# Patient Record
Sex: Male | Born: 1990 | Race: Asian | Hispanic: No | Marital: Single | Smoking: Former smoker
Health system: Southern US, Community
[De-identification: ages and names within clinical notes are randomized; demographics above are authoritative.]

## PROBLEM LIST (undated history)

## (undated) DIAGNOSIS — A150 Tuberculosis of lung: Secondary | ICD-10-CM

---

## 2016-05-25 ENCOUNTER — Telehealth: Payer: Self-pay | Admitting: Pulmonary Disease

## 2016-05-25 NOTE — Telephone Encounter (Signed)
Nurse with [facility] called, states pt is positive for TB. She states she advised us to call and cancel pt appt for tomorrow, as pt needs to be in isolation. She states pt did not want to cancel appt, due to he wanted a 2nd opinion.

## 2016-05-25 NOTE — Telephone Encounter (Signed)
Called pt and informed him we are cancelling his appt per DK since pt has tested positive for TB. Informed pt once I speak with DS that we will get a bronch scheduled per DK and proceed from there. Pt is asking that a provider calls him in regards to this. Will forward to DS.

## 2016-05-26 ENCOUNTER — Ambulatory Visit: Payer: Self-pay | Admitting: Pulmonary Disease

## 2016-05-26 NOTE — Telephone Encounter (Signed)
Noted  

## 2016-05-26 NOTE — Telephone Encounter (Signed)
Spoke with Todd Morse at [facility] and she states pt is being treated and per DS they are to take every specimen the pt can given. nothing further needed.

## 2016-05-26 NOTE — Telephone Encounter (Signed)
Per DS pt doesn't need to have a bronch at this time. He states [facility] needs to check anything sputum that the pt can bring up and needs to be treated with anti TB meds. LMOVM for the nurse to return my call.

## 2016-06-02 ENCOUNTER — Encounter: Payer: Self-pay | Admitting: Emergency Medicine

## 2016-06-02 ENCOUNTER — Emergency Department: Payer: Self-pay

## 2016-06-02 ENCOUNTER — Emergency Department
Admission: EM | Admit: 2016-06-02 | Discharge: 2016-06-02 | Disposition: A | Discharge status: Home / Self Care | Payer: Self-pay | Attending: Emergency Medicine | Admitting: Emergency Medicine

## 2016-06-02 DIAGNOSIS — Z87891 Personal history of nicotine dependence: Secondary | ICD-10-CM | POA: Insufficient documentation

## 2016-06-02 DIAGNOSIS — A159 Respiratory tuberculosis unspecified: Secondary | ICD-10-CM | POA: Insufficient documentation

## 2016-06-02 HISTORY — DX: Tuberculosis of lung: A15.0

## 2016-06-02 LAB — CBC
HEMATOCRIT: 42.5 % (ref 40.0–52.0)
Hemoglobin: 14.5 g/dL (ref 13.0–18.0)
MCH: 25.8 pg — AB (ref 26.0–34.0)
MCHC: 34.1 g/dL (ref 32.0–36.0)
MCV: 75.7 fL — AB (ref 80.0–100.0)
PLATELETS: 275 10*3/uL (ref 150–440)
RBC: 5.61 MIL/uL (ref 4.40–5.90)
RDW: 15.4 % — ABNORMAL HIGH (ref 11.5–14.5)
WBC: 6.1 10*3/uL (ref 3.8–10.6)

## 2016-06-02 LAB — BASIC METABOLIC PANEL
Anion gap: 12 (ref 5–15)
BUN: 8 mg/dL (ref 6–20)
CHLORIDE: 96 mmol/L — AB (ref 101–111)
CO2: 29 mmol/L (ref 22–32)
CREATININE: 0.83 mg/dL (ref 0.61–1.24)
Calcium: 9.6 mg/dL (ref 8.9–10.3)
GFR calc non Af Amer: >60 mL/min (ref >60)
Glucose, Bld: 90 mg/dL (ref 65–99)
POTASSIUM: 4.2 mmol/L (ref 3.5–5.1)
Sodium: 137 mmol/L (ref 135–145)

## 2016-06-02 LAB — HEPATIC FUNCTION PANEL
ALK PHOS: 96 U/L (ref 38–126)
ALT: 23 U/L (ref 17–63)
AST: 30 U/L (ref 15–41)
Albumin: 3.9 g/dL (ref 3.5–5.0)
BILIRUBIN DIRECT: 0.2 mg/dL (ref 0.1–0.5)
BILIRUBIN TOTAL: 1 mg/dL (ref 0.3–1.2)
Indirect Bilirubin: 0.8 mg/dL (ref 0.3–0.9)
Total Protein: 9 g/dL — ABNORMAL HIGH (ref 6.5–8.1)

## 2016-06-02 LAB — TROPONIN I: Troponin I: <0.03 ng/mL (ref <0.03)

## 2016-06-02 MED ORDER — SODIUM CHLORIDE 0.9 % IV BOLUS (SEPSIS)
1000.0000 mL | Freq: Once | INTRAVENOUS | Status: AC
  Administered 2016-06-02: 1000 mL via INTRAVENOUS

## 2016-06-02 MED ORDER — GUAIFENESIN-CODEINE 100-10 MG/5ML PO SOLN: 5.0000 mL | Freq: Three times a day (TID) | ORAL | 0 refills | Status: AC | PRN

## 2016-06-02 MED ORDER — IOPAMIDOL (ISOVUE-370) INJECTION 76%
75.0000 mL | Freq: Once | INTRAVENOUS | Status: AC | PRN
  Administered 2016-06-02: 75 mL via INTRAVENOUS

## 2016-06-02 NOTE — ED Notes (Signed)
chest xray at bedside

## 2016-06-02 NOTE — Consult Note (Signed)
[facility] Infectious Disease     Reason for Consult: Pulmonary TB    Referring Physician: Shirlyn Goltz  Date of Admission:  06/02/2016   Active Problems:   * No active hospital problems. *   HPI: Rockland Kotarski is a 26 y.o. 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 He feels a rattle in his right chest as well when used to just fel in L.  He has been tolerating the therapy well otherwise He has no fever,wbc nml. CT   Past Medical History:  Diagnosis Date  . TB (pulmonary tuberculosis)    History reviewed. No pertinent surgical history. Social History  Substance Use Topics  . Smoking status: Former Smoker    Quit date: 04/01/2016  . Smokeless tobacco: Never Used  . Alcohol use No   No family history on file.  Allergies: No Known Allergies  Current antibiotics: Antibiotics Given (last 72 hours)    None      MEDICATIONS:   Review of Systems - 11 systems reviewed and negative per HPI   OBJECTIVE: Temp:  [98.2 F (36.8 C)] 98.2 F (36.8 C) (02/16 1324) Pulse Rate:  [90-102] 102 (02/16 1359) Resp:  [20-27] 20 (02/16 1359) BP: (140-141)/(87-96) 140/96 (02/16 1330) SpO2:  [97 %-100 %] 100 % (02/16 1359) Weight:  [68.5 kg (151 lb)] 68.5 kg (151 lb) (02/16 1325) Physical Exam  Constitutional: He is oriented to person, place, and time. He appears well-developed and well-nourished. No distress.  HENT:  Mouth/Throat: Oropharynx is clear and moist. No oropharyngeal exudate.  Cardiovascular: Normal rate, regular rhythm and normal heart sounds. Exam reveals no gallop and no friction rub.  No murmur heard.  Pulmonary/Chest: Rhonchi L baseAbdominal: Soft. Bowel sounds are normal. He exhibits no distension. There is no tenderness.  Lymphadenopathy: He has no cervical adenopathy.  Neurological: He is alert and oriented to person, place, and time.  Skin: Skin is warm and dry. No rash noted.  No erythema.  Psychiatric: He has a normal mood and affect. His behavior is normal.     LABS: Results for orders placed or performed during the hospital encounter of 06/02/16 (from the past 48 hour(s))  Basic metabolic panel     Status: Abnormal   Collection Time: 06/02/16  1:41 PM  Result Value Ref Range   Sodium 137 135 - 145 mmol/L   Potassium 4.2 3.5 - 5.1 mmol/L   Chloride 96 (L) 101 - 111 mmol/L   CO2 29 22 - 32 mmol/L   Glucose, Bld 90 65 - 99 mg/dL   BUN 8 6 - 20 mg/dL   Creatinine, Ser 0.83 0.61 - 1.24 mg/dL   Calcium 9.6 8.9 - 10.3 mg/dL   GFR calc non Af Amer >60 >60 mL/min   GFR calc Af Amer >60 >60 mL/min    Comment: (NOTE) The eGFR has been calculated using the CKD EPI equation. This calculation has not been validated in all clinical situations. eGFR's persistently <60 mL/min signify possible Chronic Kidney Disease.    Anion gap 12 5 - 15  CBC     Status: Abnormal   Collection Time: 06/02/16  1:41 PM  Result Value Ref Range   WBC 6.1 3.8 - 10.6 K/uL   RBC 5.61 4.40 - 5.90 MIL/uL   Hemoglobin 14.5 13.0 - 18.0 g/dL   HCT 42.5 40.0 - 52.0 %   MCV 75.7 (L) 80.0 -  100.0 fL   MCH 25.8 (L) 26.0 - 34.0 pg   MCHC 34.1 32.0 - 36.0 g/dL   RDW 15.4 (H) 11.5 - 14.5 %   Platelets 275 150 - 440 K/uL  Troponin I     Status: None   Collection Time: 06/02/16  1:41 PM  Result Value Ref Range   Troponin I <0.03 <0.03 ng/mL   No components found for: ESR, C REACTIVE PROTEIN MICRO: No results found for this or any previous visit (from the past 720 hour(s)).  IMAGING: Ct Angio Chest Pe W And/or Wo Contrast  Result Date: 06/02/2016 CLINICAL DATA:  Chest pain for 3 days. Patient diagnosed with tuberculosis 1 week ago. EXAM: CT ANGIOGRAPHY CHEST WITH CONTRAST TECHNIQUE: Multidetector CT imaging of the chest was performed using the standard protocol during bolus administration of intravenous contrast. Multiplanar CT image reconstructions and MIPs were obtained to evaluate the  vascular anatomy. CONTRAST:  75 cc Isovue 370. COMPARISON:  None. FINDINGS: Cardiovascular: No pulmonary embolus is identified. Heart size is normal. No pericardial effusion. Mediastinum/Nodes: No axillary, hilar or mediastinal lymphadenopathy. Thyroid appears normal. Small left pleural effusion is identified. Lungs/Pleura: Tree-in-bud opacities are seen in the posterior right lower lobe and right upper lobe. Consolidation and cavitation are seen in the superior segment of the right lower lobe. Upper Abdomen: No acute abnormality. Musculoskeletal: Schmorl's nodes in the lower thoracic spine are identified. No worrisome bony lesion. Review of the MIP images confirms the above findings. IMPRESSION: Negative for pulmonary embolus. Bilateral tree-in-bud opacities and consolidation with cavitation in the superior segment of the left upper lobe compatible with the patient's diagnosis of tuberculosis. Electronically Signed   By: Inge Rise M.D.   On: 06/02/2016 15:03   Dg Chest Portable 1 View  Result Date: 06/02/2016 CLINICAL DATA:  Pulmonary tuberculosis. Right-sided pleuritic chest pain. EXAM: PORTABLE CHEST 1 VIEW COMPARISON:  05/18/2016 FINDINGS: There remains left-sided perihilar/ lower lobe pulmonary airspace disease and right upper lung reticulonodular disease. No edema, pneumothorax or pleural fluid identified. The heart size and mediastinal contours are within normal limits. IMPRESSION: No significant change in appearance of left perihilar/ lower lobe pulmonary airspace disease and reticulonodular disease in the right upper lobe. Electronically Signed   By: Aletta Edouard M.D.   On: 06/02/2016 15:10    Assessment:   Debbie Bellucci is a 26 y.o. male with recent dx of pulmonary TB on therapy for 10 days now with worsening cough, some chest pain. Otherwise doing well.  CT reviewed and discussed with patient.  I suspect he has IRIS which can occur with TB patients when they are about 2 weeks into  treatment. He has no signs of sepsis or other worrisome findings.   Recommendations Discussed cough suppression with cheratussin Ibuprofen 600-800 mg tid with food for 3-4 days Can follow up with health dept Thank you very much for allowing me to participate in the care of this patient. Please call with questions.   Cheral Marker. Ola Spurr, MD

## 2016-06-02 NOTE — ED Triage Notes (Signed)
Patient presents to ED via POV with c/o CP x 3 days. Patient also states both of his lungs hurt. Patient was dx with TB 1 week ago. [facility] for [facility]. He told the nurse there that he was coughing a lot and having CP and they said the CP was probably related to the coughing. Patient A&O x4. VSS.

## 2016-06-02 NOTE — ED Provider Notes (Addendum)
[facility]-EMERGENCY DEPARTMENT Provider Note   CSN: [id number] Arrival date & time: 06/02/16  1318     History   Chief Complaint Chief Complaint  Patient presents with  . Chest Pain    HPI Todd Morse is a 26 y.o. male from [place] here presenting with chest pain, shortness of breath. Patient came here from [place] in 2016. Has been coughing for several months and [facility]. He had a chest x-ray that time showed reticular nodular disease consistent with possible TB. Patient went to the health department in the following day and had labs drawn and sputum culture done. HIV and RPR were negative. Sputum culture + AFB x 2 and culture grew out TB. Patient was started on 4 drug regimen for TB on 2/7. Patient states that he has worsening chest pain and shortness of breath for several days. Denies fevers. Has persistent cough. Felt weak overall. Denies recent travel.   The history is provided by the patient.    Past Medical History:  Diagnosis Date  . TB (pulmonary tuberculosis)     There are no active problems to display for this patient.   History reviewed. No pertinent surgical history.     Home Medications    Prior to Admission medications   Not on File    Family History No family history on file.  Social History Social History  Substance Use Topics  . Smoking status: Former Smoker    Quit date: 04/01/2016  . Smokeless tobacco: Never Used  . Alcohol use No     Allergies   Patient has no known allergies.   Review of Systems Review of Systems  Respiratory: Positive for shortness of breath.   Cardiovascular: Positive for chest pain.  All other systems reviewed and are negative.    Physical Exam Updated Vital Signs BP (!) 140/96   Pulse (!) 102   Temp 98.2 F (36.8 C) (Oral)   Resp 20   Ht 5\' 7"  (1.702 m)   Wt 151 lb (68.5 kg)   SpO2 100%   BMI 23.65 kg/m   Physical Exam  Constitutional: He is oriented to person, place, and  time.  Slightly dehydrated   HENT:  Head: Normocephalic.  MM slightly dry   Eyes: EOM are normal. Pupils are equal, round, and reactive to light.  Neck: Normal range of motion. Neck supple.  Cardiovascular:  Tachycardic   Pulmonary/Chest: Effort normal.  Diminished throughout. No obvious wheezing or crackles   Abdominal: Soft. Bowel sounds are normal. He exhibits no distension. There is no tenderness. There is no guarding.  Musculoskeletal: Normal range of motion. He exhibits no edema.  Neurological: He is alert and oriented to person, place, and time. No cranial nerve deficit. Coordination normal.  Skin: Skin is warm.  Psychiatric: He has a normal mood and affect.  Nursing note and vitals reviewed.    ED Treatments / Results  Labs (all labs ordered are listed, but only abnormal results are displayed) Labs Reviewed  BASIC METABOLIC PANEL - Abnormal; Notable for the following:       Result Value   Chloride 96 (*)    All other components within normal limits  CBC - Abnormal; Notable for the following:    MCV 75.7 (*)    MCH 25.8 (*)    RDW 15.4 (*)    All other components within normal limits  TROPONIN I    EKG  EKG Interpretation None  ED ECG REPORT I, Richardean Canal, the attending physician, personally viewed and interpreted this ECG.   Date: 06/02/2016  EKG Time: 13:21   Rate: 96  Rhythm: normal EKG, normal sinus rhythm  Axis: normal  Intervals:none  ST&T Change: nonspecific   Radiology No results found.  Procedures Procedures (including critical care time)  Medications Ordered in ED Medications  sodium chloride 0.9 % bolus 1,000 mL (1,000 mLs Intravenous New Bag/Given 06/02/16 1352)  iopamidol (ISOVUE-370) 76 % injection 75 mL (75 mLs Intravenous Contrast Given 06/02/16 1444)     Initial Impression / Assessment and Plan / ED Course  I have reviewed the triage vital signs and the nursing notes.  Pertinent labs & imaging results that were available  during my care of the patient were reviewed by me and considered in my medical decision making (see chart for details).    Todd Morse is a 26 y.o. male here with cough, shortness of breath. Recent diagnosis of active TB confirmed by xray, sputum culture. Already on 4 drug regimen. I called the health department nurse, Marcelino Duster, who faxed over his records. I discussed case with Dr. Sampson Goon from ID. Given persistent cough, tachycardia, will get CT angio chest. He will see patient in the ED.   3:28 PM Labs unremarkable. CT showed active TB. Dr. Sampson Goon to see patient. Dispo as per ID. Signed out to Dr. Derrill Kay in the ED.   3:44 PM Dr. Sampson Goon saw patient. Thought that this is the expected course of the TB treatment. Recommend robitussin with guaifenesin for cough, motrin. Will follow up with ID and health department outpatient.    Final Clinical Impressions(s) / ED Diagnoses   Final diagnoses:  None    New Prescriptions New Prescriptions   No medications on file     Charlynne Pander, MD 06/02/16 1528    Charlynne Pander, MD 06/02/16 [phone]

## 2016-06-02 NOTE — Discharge Instructions (Signed)
Continue taking your medicines for TB.   Take motrin for pain or fevers   Take guaifenesin-codeine for cough.   Follow up with the health department.   Return to ER if you have fever, trouble breathing, chest pain.

## 2016-06-05 ENCOUNTER — Ambulatory Visit: Payer: Self-pay | Admitting: Pulmonary Disease

## 2016-11-12 ENCOUNTER — Ambulatory Visit
Admission: RE | Admit: 2016-11-12 | Discharge: 2016-11-12 | Disposition: A | Discharge status: Home / Self Care | Payer: Self-pay | Source: Ambulatory Visit | Attending: Family Medicine | Admitting: Family Medicine

## 2016-11-12 ENCOUNTER — Other Ambulatory Visit (HOSPITAL_COMMUNITY): Payer: Self-pay | Admitting: Family Medicine

## 2016-11-12 DIAGNOSIS — A159 Respiratory tuberculosis unspecified: Secondary | ICD-10-CM

## 2017-09-06 IMAGING — CT CT ANGIO CHEST
2 of 6 series · 19 of 46 positions shown · IV contrast (APPLIED)
Comparison: None.

CLINICAL DATA: Chest pain for 3 days. Patient diagnosed with
tuberculosis 1 week ago.

EXAM:
CT ANGIOGRAPHY CHEST WITH CONTRAST
TECHNIQUE: Multidetector CT imaging of the chest was performed using the
standard protocol during bolus administration of intravenous
contrast. Multiplanar CT image reconstructions and MIPs were
obtained to evaluate the vascular anatomy.
CONTRAST:  75 cc Isovue 370.

[Series 5: thins · axial · 0.68mm/px · z∈[-364,-69]mm · 17 of 325 slices shown]
[im 15/325  lung]
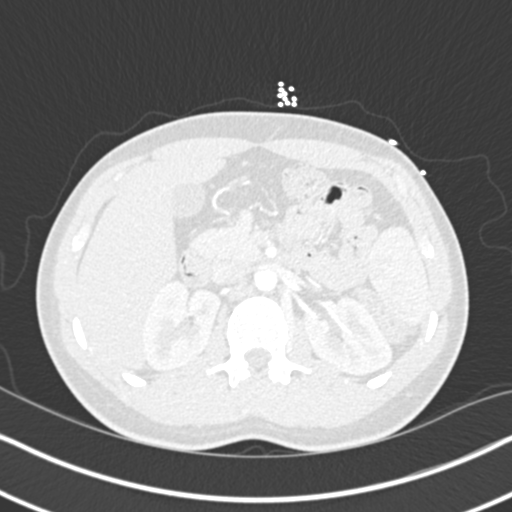
[im 29/325  soft-tissue]
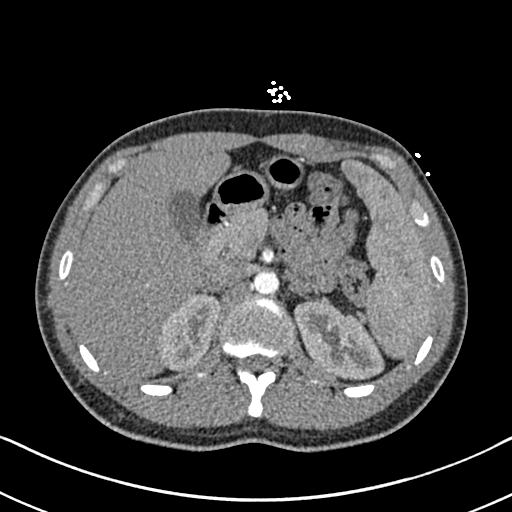
[im 57/325  lung]
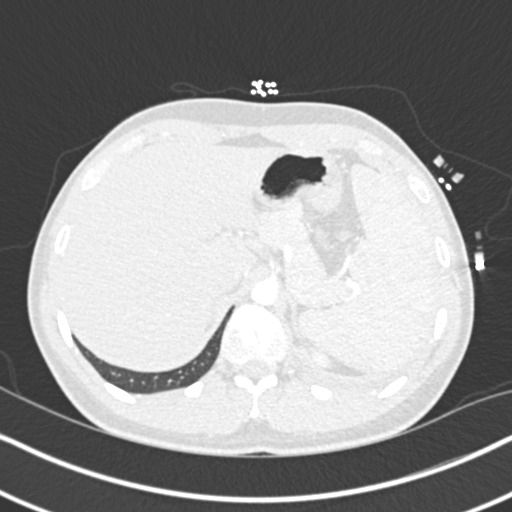
[im 71/325  soft-tissue]
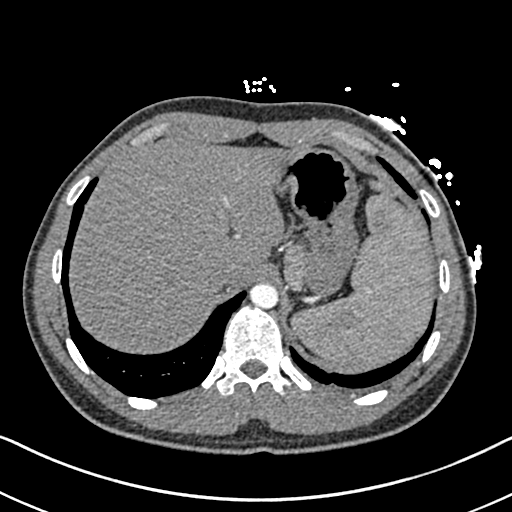
[im 85/325  lung]
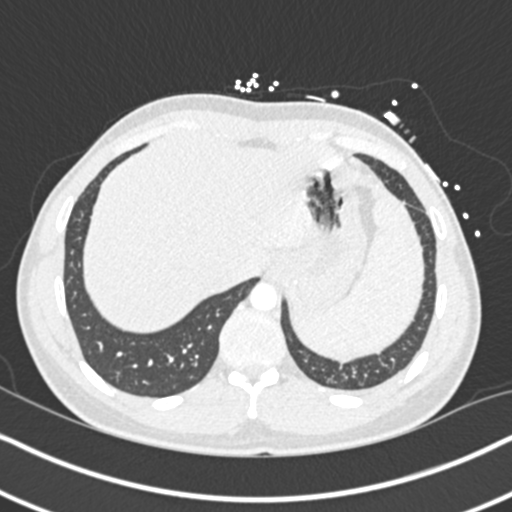
[im 113/325  soft-tissue]
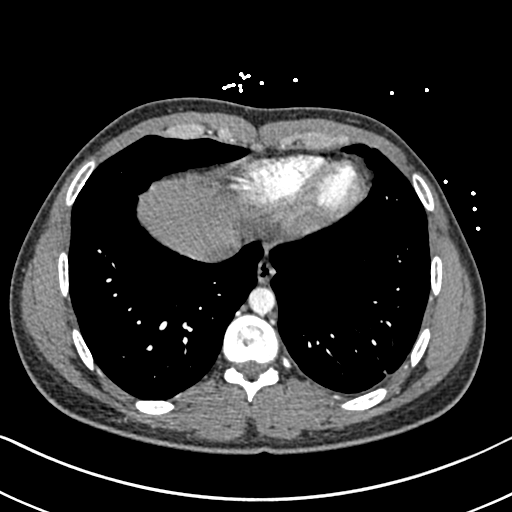
[im 127/325  lung]
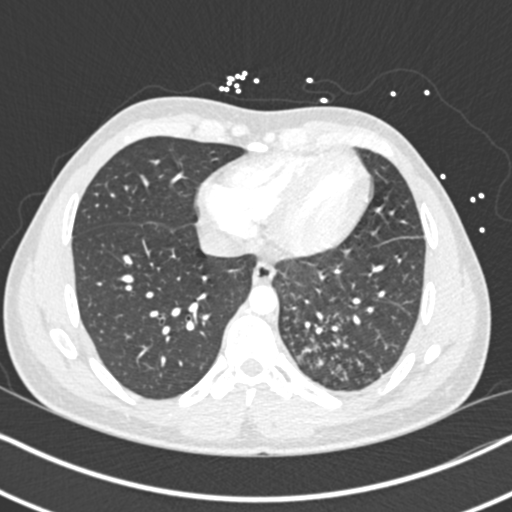
[im 141/325  soft-tissue]
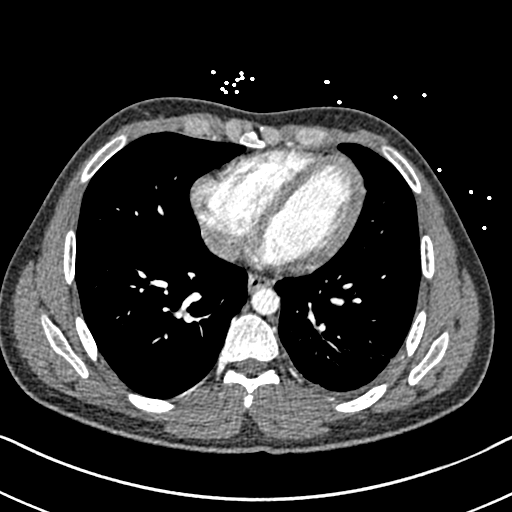
[im 170/325  lung]
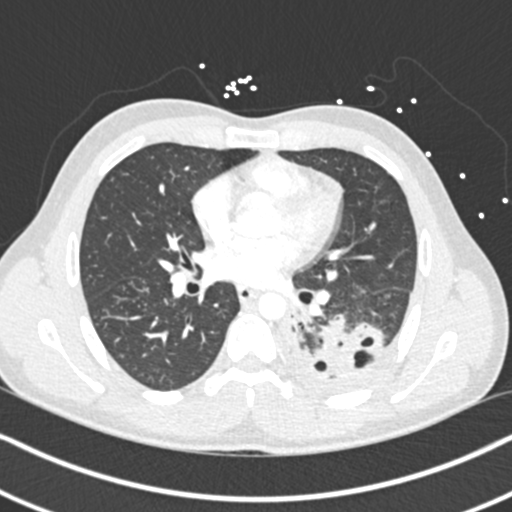
[im 184/325  soft-tissue]
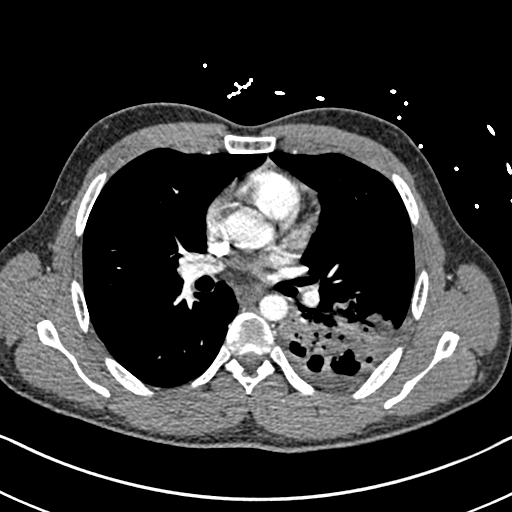
[im 198/325  lung]
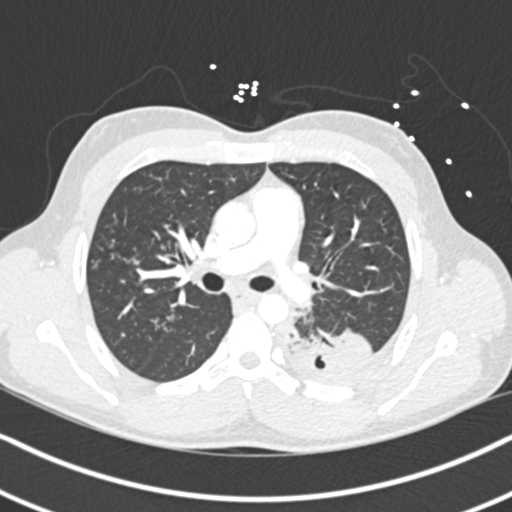
[im 212/325  soft-tissue]
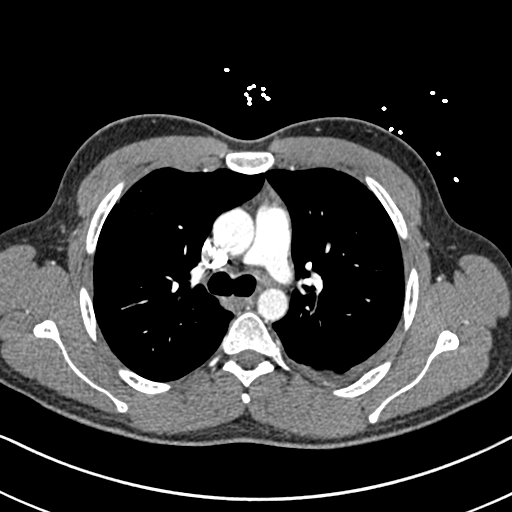
[im 240/325  lung]
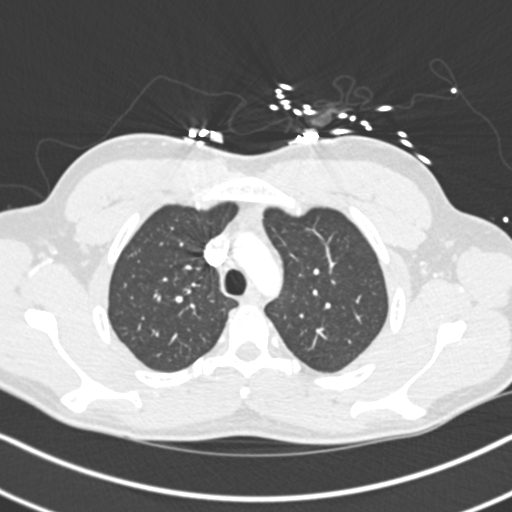
[im 254/325  soft-tissue]
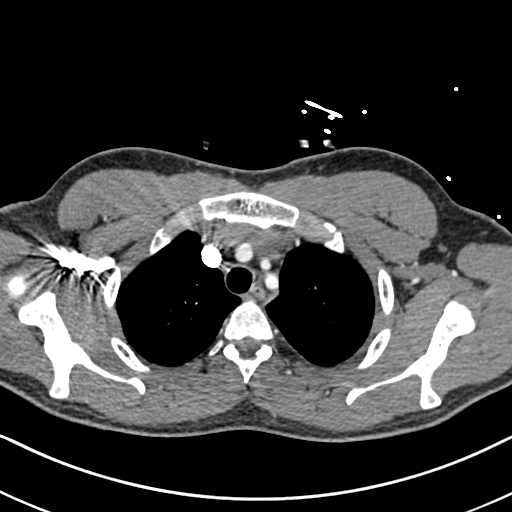
[im 268/325  lung]
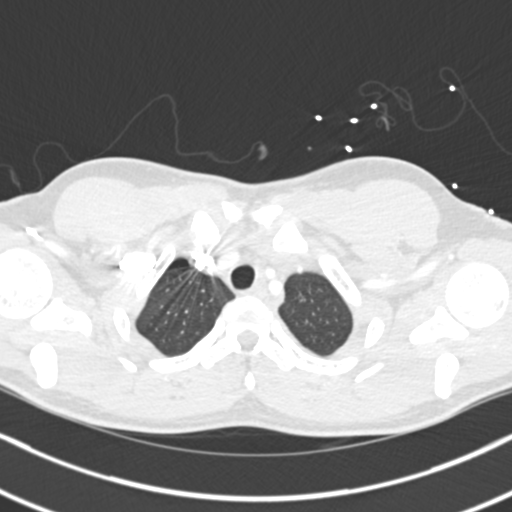
[im 296/325  soft-tissue]
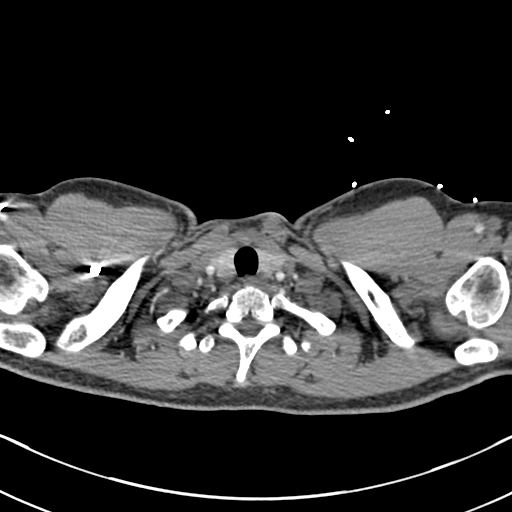
[im 310/325  lung]
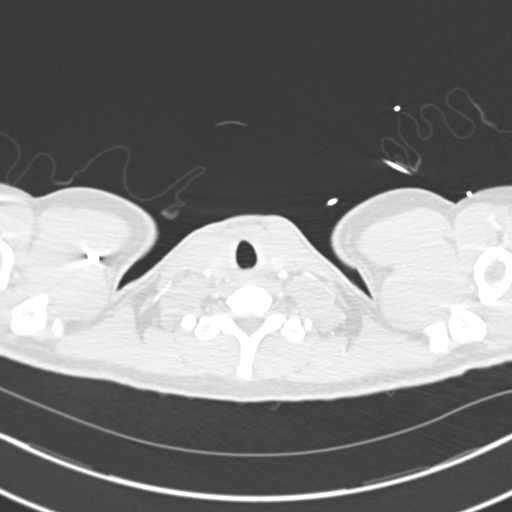

[Series 7: coronal mpr · coronal · 0.67mm/px · 2 of 80 slices shown]
[im 27/80  soft-tissue]
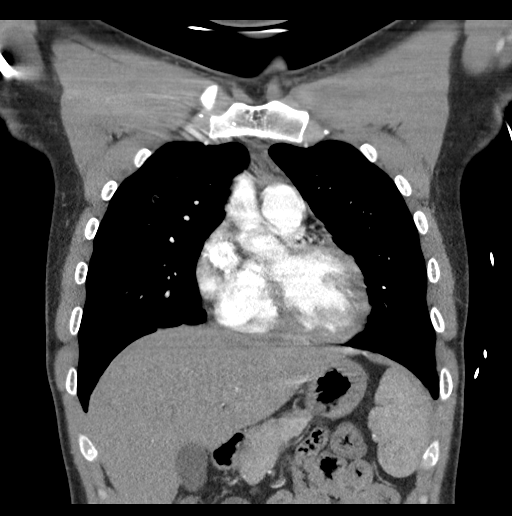
[im 53/80  soft-tissue]
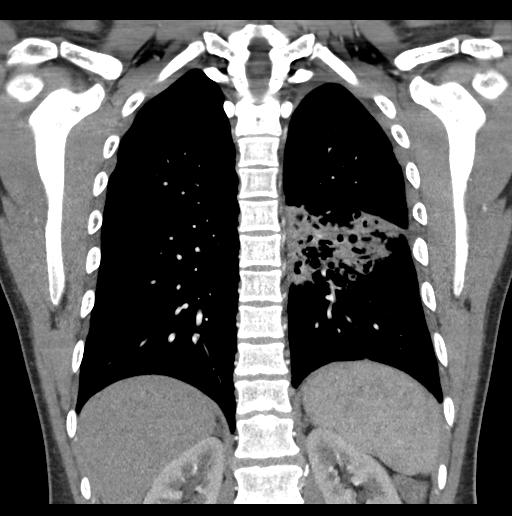

[19 of 46 positions shown; findings below may reference images not displayed]

FINDINGS: Cardiovascular: No pulmonary embolus is identified. Heart size is
normal. No pericardial effusion.

Mediastinum/Nodes: No axillary, hilar or mediastinal
lymphadenopathy. Thyroid appears normal. Small left pleural effusion
is identified.

Lungs/Pleura: Tree-in-bud opacities are seen in the posterior right
lower lobe and right upper lobe. Consolidation and cavitation are
seen in the superior segment of the right lower lobe.

Upper Abdomen: No acute abnormality.

Musculoskeletal: Schmorl's nodes in the lower thoracic spine are
identified. No worrisome bony lesion.

Review of the MIP images confirms the above findings.
IMPRESSION: Negative for pulmonary embolus.

Bilateral tree-in-bud opacities and consolidation with cavitation in
the superior segment of the left upper lobe compatible with the
patient's diagnosis of tuberculosis.

## 2018-02-16 IMAGING — CR DG CHEST 1V
1 series · 1 of 1 positions shown · non-contrast
Comparison: Chest CT 06/02/2016.

CLINICAL DATA: Patient status post treatment for tuberculosis.

EXAM:
CHEST 1 VIEW

[w chest pa]
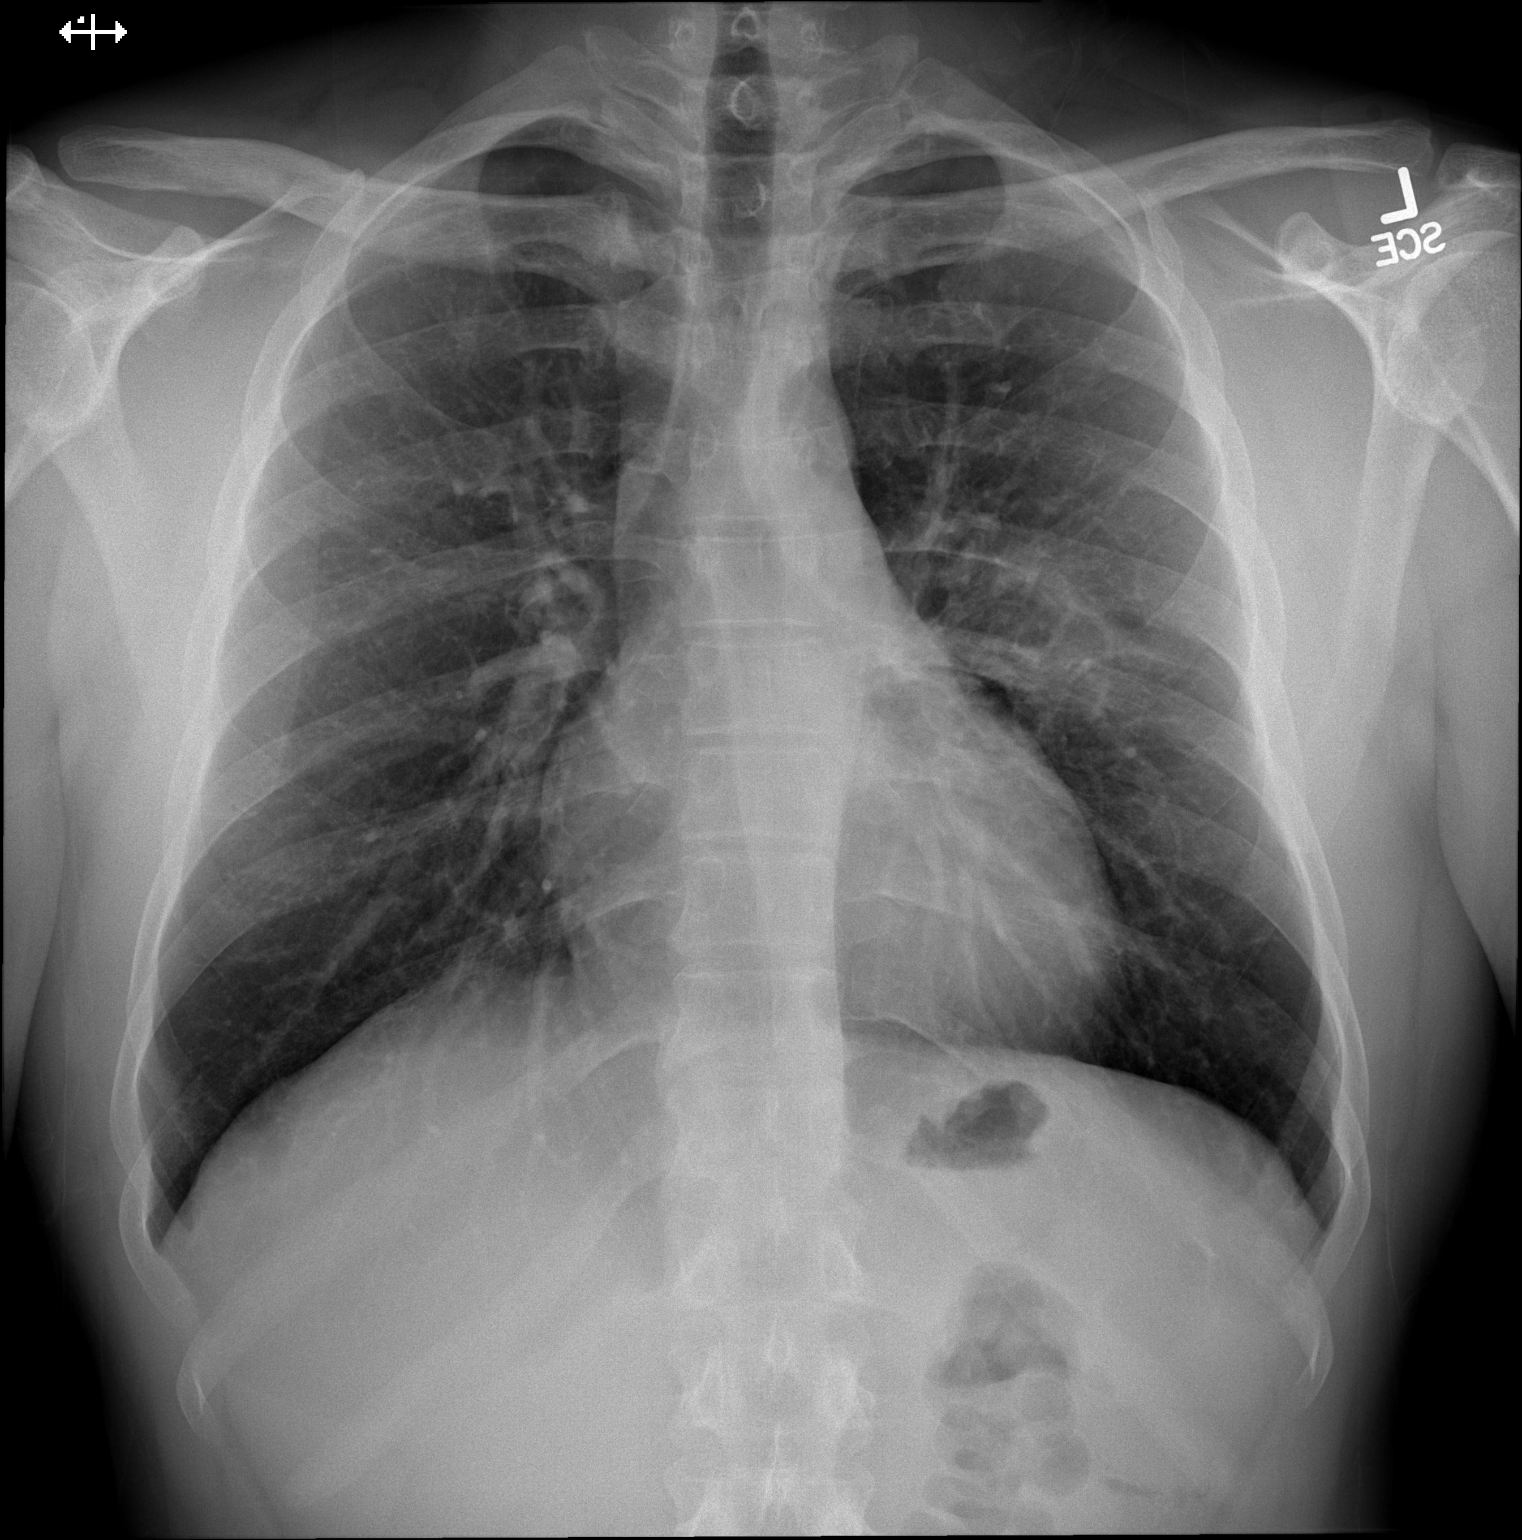

[1 of 1 positions shown; findings below may reference images not displayed]

FINDINGS: Stable cardiac and mediastinal contours. Interval improvement in
reticulonodular opacities right upper lung. Interval improvement but
not complete resolution of irregular consolidation within the left
mid lung. No pleural effusion or pneumothorax.
IMPRESSION: Interval improvement but not completely resolved left perihilar
opacity.

Interval improvement but not completely resolved reticulonodular
opacities right upper lung.
